# Patient Record
Sex: Male | Born: 1965 | Race: Black or African American | Hispanic: No | Marital: Single | Smoking: Never smoker
Health system: Southern US, Community
[De-identification: ages and names within clinical notes are randomized; demographics above are authoritative.]

## PROBLEM LIST (undated history)

## (undated) DIAGNOSIS — M5430 Sciatica, unspecified side: Secondary | ICD-10-CM

## (undated) HISTORY — DX: Sciatica, unspecified side: M54.30

---

## 2000-11-26 ENCOUNTER — Inpatient Hospital Stay (HOSPITAL_COMMUNITY): Admission: EM | Admit: 2000-11-26 | Discharge: 2000-11-27 | Payer: Self-pay | Admitting: *Deleted

## 2001-03-21 ENCOUNTER — Encounter: Payer: Self-pay | Admitting: Emergency Medicine

## 2001-03-21 ENCOUNTER — Emergency Department (HOSPITAL_COMMUNITY): Admission: EM | Admit: 2001-03-21 | Discharge: 2001-03-21 | Payer: Self-pay | Admitting: Emergency Medicine

## 2003-04-17 ENCOUNTER — Emergency Department (HOSPITAL_COMMUNITY): Admission: EM | Admit: 2003-04-17 | Discharge: 2003-04-17 | Payer: Self-pay | Admitting: *Deleted

## 2003-12-04 ENCOUNTER — Emergency Department (HOSPITAL_COMMUNITY): Admission: EM | Admit: 2003-12-04 | Discharge: 2003-12-04 | Payer: Self-pay | Admitting: Emergency Medicine
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
## 2009-12-17 ENCOUNTER — Emergency Department (HOSPITAL_COMMUNITY): Admission: EM | Admit: 2009-12-17 | Discharge: 2009-12-17 | Payer: Self-pay | Admitting: Emergency Medicine

## 2011-01-05 NOTE — H&P (Signed)
[facility]  Patient:    Todd Flynn, Todd Flynn                      MRN: [id number] Adm. Date:  [id number] Disc. Date: [id number] Attending:  Milford Cage H                   Psychiatric Admission Assessment  IDENTIFYING INFORMATION:  This 45 year old male was admitted after an overdose attempt on eight Tylox.  HISTORY OF PRESENT ILLNESS:  The patient states he has become increasingly depressed and has been ruminating about the death of his mother in 2000-03-27. In addition to his depression and sadness, he has had multiple neurovegetative symptoms, including anhedonia, anergia, difficulty concentrating, insomnia (difficulty falling asleep, middle of the night awakening, early morning awakening), and feelings of hopelessness/worthlessness.  He has had some suicidal ideation, but states that he did not intend to commit suicide.  He was taking the Tylox to relieve some back pain.  He admits that he used very poor judgment in taking eight of these pills, but states his mother used to take that many at a time when she was having pain.  PAST PSYCHIATRIC HISTORY:  The patient has had no previous psychiatric treatment.  PAST MEDICAL HISTORY:  Back pain, otherwise negative.  MEDICATIONS:  None.  DRUG ALLERGIES:  No known drug allergies.  FAMILY HISTORY:  His sister was in [facility] for psychiatric problems.  His sister also had a history of drug abuse.  SUBSTANCE ABUSE HISTORY:  Patient denies.  SOCIAL HISTORY:  The patient lives alone.  He is divorced and has one daughter.  He has a fiancee and states she is very supportive.  He also states he cares a lot for her children.  He denies any physical or sexual abuse.  There are no legal problems.  ADMISSION MENTAL STATUS EXAMINATION:  The patient presented as a friendly, cooperative African-American male with good eye contact.  He was dressed casually with mild psychomotor retardation.  Speech  normal rate and flow. Mood depressed, anxious, but affect wide range.  He denied suicidal or homicidal ideation.  He states he took the Tylox to relieve his back pain, but realizes this was using poor judgment.  There is no evidence of psychosis or perceptual disturbance.  Thought processes logical and goal directed.  Thought content:  No predominant theme.  On cognitive exam, the patient was alert and oriented x 4.  Short term and long term memory were adequate.  General fund of knowledge age and education level appropriate.  Attention and concentration somewhat diminished.  Judgment good, insight good.  ADMISSION DIAGNOSES: Axis I:    Depressive disorder, not otherwise specified. Axis II:   Deferred. Axis III:  Back pain, otherwise healthy. Axis IV:   Severe. Axis V:    Global assessment of functioning, current is 30, highest past            year 70.  STRENGTHS AND ASSETS:  The patient is warm and engaging.  He is able to work to support himself and his family.  He has a help seeking attitude.  He has a supportive fiancee.  PROBLEMS:  Mood instability with vague suicidal ideation.  SHORT TERM TREATMENT GOAL:  Resolution of any suicidal ideation and poor judgment regarding medication use.  LONG TERM TREATMENT GOAL:  Resolution of mood instability.  HOSPITAL COURSE:  The patient stayed for a brief crisis stabilization.  He was able to  talk about the death of his mother.  He states they had been extremely close and it has been difficult for him to deal with her death.  He states, however, that he has a supportive fiancee and his own child as well as her children, whom he loves, and feels that he is reestablishing a family.  He had been working extra hours and we discussed his decreasing these work hours (to the point that he is not working daily).  He has a number of debts, but admits that he is going to have to cut down on his time at work.  His fiancee is very supportive about  this.  He agreed to a trial of an antidepressant, and I am discharging him on Celexa 20 mg q.a.m.  PHYSICAL EXAMINATION:  Was within normal limits except for his being overweight and having back pain.  ADMISSION LABORATORIES:  CBC with differential within normal limits.  Routine chemistry profile within normal limits.  Urine drug screen negative.  Other labs were done in the emergency room prior to his admission.  Thyroid panel still pending.  DISCHARGE MENTAL STATUS EXAMINATION:  The patient continued to be friendly with good eye contact.  Psychomotor retardation had resolved.  Speech normal rate and slow.  Mood less depressed and anxious, but still somewhat affect wide range.  No suicidal or homicidal ideation.  No psychosis or perceptual disturbance.  Thought processes logical and goal directed.  Cognitive back to baseline.  Judgment and insight good.  DISCHARGE DIAGNOSES: Axis I:    Major depressive disorder, single episode, severe, without            psychosis. Axis II:   None. Axis III:  Back pain, otherwise healthy. Axis IV:   Severe. Axis V:    Current global assessment of functioning 50, highest past year            70, admission global assessment of functioning 30.  DISCHARGE MEDICATIONS:  Celexa 20 mg p.o. q.a.m.  ACTIVITY LEVEL:  No restrictions.  DIET:  No restrictions.  POST HOSPITAL CARE PLANS:  The patient will return home.  He has a lot of support from his fiancee.  He is determined to cut down on his work hours in spite of his debt load.  He will follow up at the [facility]. DD:  11/27/00 TD:  11/27/00 Job: [id number]

## 2017-01-05 ENCOUNTER — Encounter (HOSPITAL_COMMUNITY): Payer: Self-pay | Admitting: *Deleted

## 2017-01-05 ENCOUNTER — Emergency Department (HOSPITAL_COMMUNITY)
Admission: EM | Admit: 2017-01-05 | Discharge: 2017-01-05 | Disposition: A | Discharge status: Home / Self Care | Payer: Self-pay | Attending: Emergency Medicine | Admitting: Emergency Medicine

## 2017-01-05 DIAGNOSIS — J029 Acute pharyngitis, unspecified: Secondary | ICD-10-CM | POA: Insufficient documentation

## 2017-01-05 DIAGNOSIS — R1310 Dysphagia, unspecified: Secondary | ICD-10-CM

## 2017-01-05 MED ORDER — SUCRALFATE 1 GM/10ML PO SUSP: 1.0000 g | Freq: Three times a day (TID) | ORAL | 0 refills | Status: AC

## 2017-01-05 MED ORDER — GI COCKTAIL ~~LOC~~
30.0000 mL | Freq: Once | ORAL | Status: AC
  Administered 2017-01-05: 30 mL via ORAL
  Filled 2017-01-05: qty 30

## 2017-01-05 MED ORDER — OMEPRAZOLE 20 MG PO CPDR: 20.0000 mg | DELAYED_RELEASE_CAPSULE | Freq: Every day | ORAL | 1 refills | Status: DC

## 2017-01-05 NOTE — Discharge Instructions (Signed)
We believe your symptoms are a result of GERD (acid reflux).  Please read through the included information and follow up with your regular doctor.  In the meantime, we encourage you to try an over-the-counter medication such as Prilosec OTC.  Give it at least a week at see if your symptoms improve. ° °Return to the Emergency Department with new or worsening symptoms that concern you. ° ° °Heartburn °Heartburn is a type of pain or discomfort that can happen in the throat or chest. It is often described as a burning pain. It may also cause a bad taste in the mouth. Heartburn may feel worse when you lie down or bend over, and it is often worse at night. Heartburn may be caused by stomach contents that move back up into the esophagus (reflux). °HOME CARE INSTRUCTIONS °Take these actions to decrease your discomfort and to help avoid complications. °Diet °Follow a diet as recommended by your health care provider. This may involve avoiding foods and drinks such as: °Coffee and tea (with or without caffeine). °Drinks that contain alcohol. °Energy drinks and sports drinks. °Carbonated drinks or sodas. °Chocolate and cocoa. °Peppermint and mint flavorings. °Garlic and onions. °Horseradish. °Spicy and acidic foods, including peppers, chili powder, curry powder, vinegar, hot sauces, and barbecue sauce. °Citrus fruit juices and citrus fruits, such as oranges, lemons, and limes. °Tomato-based foods, such as red sauce, chili, salsa, and pizza with red sauce. °Fried and fatty foods, such as donuts, french fries, potato chips, and high-fat dressings. °High-fat meats, such as hot dogs and fatty cuts of red and white meats, such as rib eye steak, sausage, ham, and bacon. °High-fat dairy items, such as whole milk, butter, and cream cheese. °Eat small, frequent meals instead of large meals. °Avoid drinking large amounts of liquid with your meals. °Avoid eating meals during the 2-3 hours before bedtime. °Avoid lying down right after you  eat. °Do not exercise right after you eat. ° General Instructions  °Pay attention to any changes in your symptoms. °Take over-the-counter and prescription medicines only as told by your health care provider. Do not take aspirin, ibuprofen, or other NSAIDs unless your health care provider told you to do so. °Do not use any tobacco products, including cigarettes, chewing tobacco, and e-cigarettes. If you need help quitting, ask your health care provider. °Wear loose-fitting clothing. Do not wear anything tight around your waist that causes pressure on your abdomen. °Raise (elevate) the head of your bed about 6 inches (15 cm). °Try to reduce your stress, such as with yoga or meditation. If you need help reducing stress, ask your health care provider. °If you are overweight, reduce your weight to an amount that is healthy for you. Ask your health care provider for guidance about a safe weight loss goal. °Keep all follow-up visits as told by your health care provider. This is important. °SEEK MEDICAL CARE IF: °You have new symptoms. °You have unexplained weight loss. °You have difficulty swallowing, or it hurts to swallow. °You have wheezing or a persistent cough. °Your symptoms do not improve with treatment. °You have frequent heartburn for more than two weeks. °SEEK IMMEDIATE MEDICAL CARE IF: °You have pain in your arms, neck, jaw, teeth, or back. °You feel sweaty, dizzy, or light-headed. °You have chest pain or shortness of breath. °You vomit and your vomit looks like blood or coffee grounds. °Your stool is bloody or black. °  °This information is not intended to replace advice given to you by your health   care provider. Make sure you discuss any questions you have with your health care provider. °  °Document Released: 12/23/2008 Document Revised: 04/27/2015 Document Reviewed: 12/01/2014 °Elsevier Interactive Patient Education ©2016 [employer] ° °Food Choices for Gastroesophageal Reflux Disease, Adult °When you have  gastroesophageal reflux disease (GERD), the foods you eat and your eating habits are very important. Choosing the right foods can help ease the discomfort of GERD. °WHAT GENERAL GUIDELINES DO I NEED TO FOLLOW? °Choose fruits, vegetables, whole grains, low-fat dairy products, and low-fat meat, fish, and poultry. °Limit fats such as oils, salad dressings, butter, nuts, and avocado. °Keep a food diary to identify foods that cause symptoms. °Avoid foods that cause reflux. These may be different for different people. °Eat frequent small meals instead of three large meals each day. °Eat your meals slowly, in a relaxed setting. °Limit fried foods. °Cook foods using methods other than frying. °Avoid drinking alcohol. °Avoid drinking large amounts of liquids with your meals. °Avoid bending over or lying down until 2-3 hours after eating. °WHAT FOODS ARE NOT RECOMMENDED? °The following are some foods and drinks that may worsen your symptoms: °Vegetables °Tomatoes. Tomato juice. Tomato and spaghetti sauce. Chili peppers. Onion and garlic. Horseradish. °Fruits °Oranges, grapefruit, and lemon (fruit and juice). °Meats °High-fat meats, fish, and poultry. This includes hot dogs, ribs, ham, sausage, salami, and bacon. °Dairy °Whole milk and chocolate milk. Sour cream. Cream. Butter. Ice cream. Cream cheese.  °Beverages °Coffee and tea, with or without caffeine. Carbonated beverages or energy drinks. °Condiments °Hot sauce. Barbecue sauce.  °Sweets/Desserts °Chocolate and cocoa. Donuts. Peppermint and spearmint. °Fats and Oils °High-fat foods, including French fries and potato chips. °Other °Vinegar. Strong spices, such as black pepper, white pepper, red pepper, cayenne, curry powder, cloves, ginger, and chili powder. °The items listed above may not be a complete list of foods and beverages to avoid. Contact your dietitian for more information. °  °This information is not intended to replace advice given to you by your health care  provider. Make sure you discuss any questions you have with your health care provider. °  °Document Released: 08/06/2005 Document Revised: 08/27/2014 Document Reviewed: 06/10/2013 °Elsevier Interactive Patient Education ©2016 [employer]vier Inc. ° ° °

## 2017-01-05 NOTE — ED Notes (Signed)
Pt discharged from ED.  AVS and Prescriptions reviewed with verbalized understandings.

## 2017-01-05 NOTE — ED Provider Notes (Signed)
Emergency Department Provider Note By signing my name below, I, Levon HedgerElizabeth Hall, attest that this documentation has been prepared under the direction and in the presence of Aliceson Dolbow, Arlyss RepressJoshua G, MD . Electronically Signed: Levon HedgerElizabeth Hall, Scribe. 01/05/2017. 9:51 PM.   I have reviewed the triage vital signs and the nursing notes.   HISTORY  Chief Complaint Sore Throat  HPI Orlando PennerOtis Garciagarcia is a 51 y.o. male who presents to the Emergency Department complaining of intermittent, moderate sore throat onset four days ago. Pt states he was eating pizza four days ago when he felt as if the food became stuck, causing him to vomit 1x. Since this episode, pt reports experienceing throat discomfort with associated chest congestion.  Per pt, he feels a "large, painful lump" when he swallows.Pain is exacerbated by eating and alleviated by drinking water. No OTC treatments tried for these symptoms PTA. Symptoms are unchanged by walking or exertion. Pt is not currently followed by a GI or a PCP. Pt state he lost his health insurance last month. He denies any changes to his voice or trismus. Pt has no other acute complaints or associated symptoms at this time.    History reviewed. No pertinent past medical history.  There are no active problems to display for this patient.   History reviewed. No pertinent surgical history.   Allergies Patient has no known allergies.  History reviewed. No pertinent family history.  Social History Social History  Substance Use Topics  . Smoking status: Never Smoker  . Smokeless tobacco: Never Used  . Alcohol use No    Review of Systems  Constitutional: No fever/chills Eyes: No visual changes. ENT: Positive sore throat. No voice changes. Cardiovascular: Denies chest pain. Respiratory: Denies shortness of breath. Gastrointestinal: No abdominal pain.  No nausea, 1 episode of vomiting.  No diarrhea.  No constipation. Genitourinary: Negative for  dysuria. Musculoskeletal: Negative for back pain. Skin: Negative for rash. Neurological: Negative for headaches, focal weakness or numbness.  10-point ROS otherwise negative.  ____________________________________________   PHYSICAL EXAM:  VITAL SIGNS: ED Triage Vitals  Enc Vitals Group     BP 01/05/17 2134 139/89     Pulse Rate 01/05/17 2134 99     Resp 01/05/17 2134 20     Temp 01/05/17 2134 98.8 F (37.1 C)     Temp src --      SpO2 01/05/17 2134 99 %     Weight 01/05/17 2136 (!) 345 lb (156.5 kg)     Height 01/05/17 2136 6\' 5"  (1.956 m)     Pain Score 01/05/17 2134 5   Constitutional: Alert and oriented. Well appearing and in no acute distress. Eyes: Conjunctivae are normal.  Head: Atraumatic. Nose: No congestion/rhinnorhea. Mouth/Throat: Mucous membranes are moist.  Oropharynx non-erythematous. No trismus. Managing oral secretions. No muffled voice.  Neck: No stridor.   Cardiovascular: Normal rate, regular rhythm. Good peripheral circulation. Grossly normal heart sounds.   Respiratory: Normal respiratory effort.  No retractions. Lungs CTAB. Gastrointestinal: Soft and nontender. No distention.  Musculoskeletal: No lower extremity tenderness nor edema. No gross deformities of extremities. Neurologic:  Normal speech and language. No gross focal neurologic deficits are appreciated.  Skin:  Skin is warm, dry and intact. No rash noted.  ____________________________________________   PROCEDURES  Procedure(s) performed:   Procedures  None ____________________________________________   INITIAL IMPRESSION / ASSESSMENT AND PLAN / ED COURSE  Pertinent labs & imaging results that were available during my care of the patient were reviewed by me  and considered in my medical decision making (see chart for details).  Patient presents to the ED with sore throat with swallowing and occasional sensation of food getting stuck. No PCP. No acute concern for esophageal food  impaction. Patient with occasional epigastric discomfort. Unclear if this represents GERD, esophageal stricture, or hiatal hernia. Plan for conservative mgmt at home and referral to PCP. Gave contact info for GI in the area but patient without insurance. No chest pain or increased concern for atypical ACS presentation.   At this time, I do not feel there is any life-threatening condition present. I have reviewed and discussed all results (EKG, imaging, lab, urine as appropriate), exam findings with patient. I have reviewed nursing notes and appropriate previous records.  I feel the patient is safe to be discharged home without further emergent workup. Discussed usual and customary return precautions. Patient and family (if present) verbalize understanding and are comfortable with this plan.  Patient will follow-up with their primary care provider. If they do not have a primary care provider, information for follow-up has been provided to them. All questions have been answered.   ____________________________________________  FINAL CLINICAL IMPRESSION(S) / ED DIAGNOSES  Final diagnoses:  Pain with swallowing     MEDICATIONS GIVEN DURING THIS VISIT:  Medications  gi cocktail (Maalox,Lidocaine,Donnatal) (30 mLs Oral Given 01/05/17 2150)     NEW OUTPATIENT MEDICATIONS STARTED DURING THIS VISIT:  Discharge Medication List as of 01/05/2017  9:51 PM    START taking these medications   Details  omeprazole (PRILOSEC) 20 MG capsule Take 1 capsule (20 mg total) by mouth daily., Starting Sat 01/05/2017, Until Mon 02/04/2017, Print    sucralfate (CARAFATE) 1 GM/10ML suspension Take 10 mLs (1 g total) by mouth 4 (four) times daily -  with meals and at bedtime., Starting Sat 01/05/2017, Print       I personally performed the services described in this documentation, which was scribed in my presence. The recorded information has been reviewed and is accurate.   Note:  This document was prepared using  Dragon voice recognition software and may include unintentional dictation errors.  Alona Bene, MD Emergency Medicine    Timea Breed, Arlyss Repress, MD 01/06/17 [phone]

## 2017-01-05 NOTE — ED Triage Notes (Signed)
Pt reports a sore throat "like a lump" in his throat and chest congestion. Pt reports emesis as well earlier in the week.

## 2017-01-05 NOTE — ED Triage Notes (Signed)
Sore throat and congestion for several days  No PCP currently

## 2017-02-04 ENCOUNTER — Encounter: Payer: Self-pay | Admitting: Internal Medicine

## 2017-03-27 ENCOUNTER — Ambulatory Visit: Payer: Self-pay | Admitting: Gastroenterology

## 2017-12-19 ENCOUNTER — Encounter (HOSPITAL_COMMUNITY): Payer: Self-pay | Admitting: Emergency Medicine

## 2017-12-19 ENCOUNTER — Other Ambulatory Visit: Payer: Self-pay

## 2017-12-19 ENCOUNTER — Emergency Department (HOSPITAL_COMMUNITY)
Admission: EM | Admit: 2017-12-19 | Discharge: 2017-12-19 | Disposition: A | Discharge status: Home / Self Care | Payer: Self-pay | Attending: Emergency Medicine | Admitting: Emergency Medicine

## 2017-12-19 DIAGNOSIS — E860 Dehydration: Secondary | ICD-10-CM | POA: Insufficient documentation

## 2017-12-19 DIAGNOSIS — R197 Diarrhea, unspecified: Secondary | ICD-10-CM | POA: Insufficient documentation

## 2017-12-19 DIAGNOSIS — R112 Nausea with vomiting, unspecified: Secondary | ICD-10-CM | POA: Insufficient documentation

## 2017-12-19 DIAGNOSIS — Z79899 Other long term (current) drug therapy: Secondary | ICD-10-CM | POA: Insufficient documentation

## 2017-12-19 LAB — URINALYSIS, ROUTINE W REFLEX MICROSCOPIC
BILIRUBIN URINE: NEGATIVE
Bacteria, UA: NONE SEEN
Glucose, UA: NEGATIVE mg/dL
Ketones, ur: NEGATIVE mg/dL
Leukocytes, UA: NEGATIVE
NITRITE: NEGATIVE
PH: 5 (ref 5.0–8.0)
Protein, ur: NEGATIVE mg/dL
SPECIFIC GRAVITY, URINE: 1.012 (ref 1.005–1.030)

## 2017-12-19 LAB — COMPREHENSIVE METABOLIC PANEL
ALK PHOS: 61 U/L (ref 38–126)
ALT: 37 U/L (ref 17–63)
AST: 26 U/L (ref 15–41)
Albumin: 3.3 g/dL — ABNORMAL LOW (ref 3.5–5.0)
Anion gap: 9 (ref 5–15)
BUN: 14 mg/dL (ref 6–20)
CALCIUM: 8.6 mg/dL — AB (ref 8.9–10.3)
CO2: 24 mmol/L (ref 22–32)
CREATININE: 1.37 mg/dL — AB (ref 0.61–1.24)
Chloride: 107 mmol/L (ref 101–111)
GFR calc non Af Amer: 57 mL/min — ABNORMAL LOW (ref >60)
GLUCOSE: 115 mg/dL — AB (ref 65–99)
Potassium: 3.7 mmol/L (ref 3.5–5.1)
SODIUM: 140 mmol/L (ref 135–145)
Total Bilirubin: 0.6 mg/dL (ref 0.3–1.2)
Total Protein: 6.9 g/dL (ref 6.5–8.1)

## 2017-12-19 LAB — CBC WITH DIFFERENTIAL/PLATELET
BASOS PCT: 0 %
Basophils Absolute: 0 10*3/uL (ref 0.0–0.1)
EOS ABS: 0.3 10*3/uL (ref 0.0–0.7)
EOS PCT: 6 %
HCT: 41.8 % (ref 39.0–52.0)
Hemoglobin: 13.9 g/dL (ref 13.0–17.0)
LYMPHS ABS: 1.6 10*3/uL (ref 0.7–4.0)
Lymphocytes Relative: 36 %
MCH: 26.8 pg (ref 26.0–34.0)
MCHC: 33.3 g/dL (ref 30.0–36.0)
MCV: 80.5 fL (ref 78.0–100.0)
MONO ABS: 0.5 10*3/uL (ref 0.1–1.0)
MONOS PCT: 11 %
NEUTROS PCT: 47 %
Neutro Abs: 2.1 10*3/uL (ref 1.7–7.7)
PLATELETS: 121 10*3/uL — AB (ref 150–400)
RBC: 5.19 MIL/uL (ref 4.22–5.81)
RDW: 14.4 % (ref 11.5–15.5)
WBC: 4.5 10*3/uL (ref 4.0–10.5)

## 2017-12-19 MED ORDER — PROMETHAZINE HCL 25 MG PO TABS: 25.0000 mg | ORAL_TABLET | Freq: Four times a day (QID) | ORAL | 0 refills | Status: AC | PRN

## 2017-12-19 MED ORDER — SODIUM CHLORIDE 0.9 % IV BOLUS
1000.0000 mL | Freq: Once | INTRAVENOUS | Status: AC
  Administered 2017-12-19: 1000 mL via INTRAVENOUS

## 2017-12-19 MED ORDER — ONDANSETRON HCL 4 MG/2ML IJ SOLN
4.0000 mg | Freq: Once | INTRAMUSCULAR | Status: AC
  Administered 2017-12-19: 4 mg via INTRAVENOUS
  Filled 2017-12-19: qty 2

## 2017-12-19 NOTE — ED Triage Notes (Signed)
PT c/o dizziness/lightheadness  developing last night. PT states he had a recent stomach virus with n/v/d x3 days ago.

## 2017-12-19 NOTE — ED Provider Notes (Signed)
[facility] EMERGENCY DEPARTMENT Provider Note   CSN: [id number] Arrival date & time: 12/19/17  8657     History   Chief Complaint Chief Complaint  Patient presents with  . Dizziness    HPI Britney Captain is a 52 y.o. male.  Pt presents to the ED today with dizziness.  The pt said he's had n/v/d for the past 3 days.  This morning, he got up and felt dizzy.  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  He denies any pain.  No f/c.     History reviewed. No pertinent past medical history.  There are no active problems to display for this patient.   History reviewed. No pertinent surgical history.      Home Medications    Prior to Admission medications   Medication Sig Start Date End Date Taking? Authorizing Provider  omeprazole (PRILOSEC) 20 MG capsule Take 1 capsule (20 mg total) by mouth daily. 01/05/17 02/04/17  Long, Arlyss Repress, MD  promethazine (PHENERGAN) 25 MG tablet Take 1 tablet (25 mg total) by mouth every 6 (six) hours as needed for nausea or vomiting. 12/19/17   Jacalyn Lefevre, MD  sucralfate (CARAFATE) 1 GM/10ML suspension Take 10 mLs (1 g total) by mouth 4 (four) times daily -  with meals and at bedtime. 01/05/17   Long, Arlyss Repress, MD    Family History History reviewed. No pertinent family history.  Social History Social History   Tobacco Use  . Smoking status: Never Smoker  . Smokeless tobacco: Never Used  Substance Use Topics  . Alcohol use: No  . Drug use: No     Allergies   Patient has no known allergies.   Review of Systems Review of Systems  Gastrointestinal: Positive for diarrhea, nausea and vomiting.  Neurological: Positive for weakness.  All other systems reviewed and are negative.    Physical Exam Updated Vital Signs BP 108/80 (BP Location: Left Arm)   Pulse 75   Temp 98.6 F (37 C) (Oral)   Resp (!) 22   Ht  (1.956 m)   Wt (!) 148.8 kg (328 lb)   SpO2 93%   BMI 38.90 kg/m   Physical  Exam  Constitutional: He is oriented to person, place, and time. He appears well-developed and well-nourished.  HENT:  Head: Normocephalic and atraumatic.  Right Ear: External ear normal.  Left Ear: External ear normal.  Nose: Nose normal.  Mouth/Throat: Mucous membranes are dry.  Eyes: Pupils are equal, round, and reactive to light. Conjunctivae and EOM are normal.  Neck: Normal range of motion. Neck supple.  Cardiovascular: Normal rate, regular rhythm, normal heart sounds and intact distal pulses.  Pulmonary/Chest: Effort normal and breath sounds normal.  Abdominal: Soft. Bowel sounds are normal.  Musculoskeletal: Normal range of motion.  Neurological: He is alert and oriented to person, place, and time.  Skin: Skin is warm. Capillary refill takes less than 2 seconds.  Psychiatric: He has a normal mood and affect. His behavior is normal. Judgment and thought content normal.  Nursing note and vitals reviewed.    ED Treatments / Results  Labs (all labs ordered are listed, but only abnormal results are displayed) Labs Reviewed  COMPREHENSIVE METABOLIC PANEL - Abnormal; Notable for the following components:      Result Value   Glucose, Bld 115 (*)    Creatinine, Ser 1.37 (*)    Calcium 8.6 (*)    Albumin 3.3 (*)  GFR calc non Af Amer 57 (*)    All other components within normal limits  CBC WITH DIFFERENTIAL/PLATELET - Abnormal; Notable for the following components:   Platelets 121 (*)    All other components within normal limits  URINALYSIS, ROUTINE W REFLEX MICROSCOPIC - Abnormal; Notable for the following components:   Hgb urine dipstick SMALL (*)    All other components within normal limits    EKG None  Radiology No results found.  Procedures Procedures (including critical care time)  Medications Ordered in ED Medications  sodium chloride 0.9 % bolus 1,000 mL (0 mLs Intravenous Stopped 12/19/17 1151)  ondansetron (ZOFRAN) injection 4 mg (4 mg Intravenous Given  12/19/17 1032)     Initial Impression / Assessment and Plan / ED Course  I have reviewed the triage vital signs and the nursing notes.  Pertinent labs & imaging results that were available during my care of the patient were reviewed by me and considered in my medical decision making (see chart for details).    After 1L NS, pt is feeling much better.  He is stable for d/c and instructed to return if worse.   Final Clinical Impressions(s) / ED Diagnoses   Final diagnoses:  Dehydration  Nausea vomiting and diarrhea    ED Discharge Orders        Ordered    promethazine (PHENERGAN) 25 MG tablet  Every 6 hours PRN     12/19/17 1209       Jacalyn Lefevre, MD 12/19/17 1210

## 2017-12-19 NOTE — ED Notes (Signed)
Pt c/o dizziness this morning.  States he has had vomiting and diarrhea the last 3 days.  Denies any pain

## 2019-10-13 ENCOUNTER — Ambulatory Visit: Payer: Self-pay | Admitting: *Deleted

## 2019-10-13 NOTE — Telephone Encounter (Signed)
Pt reports he was cleaning out his 2 dogs "Units" Sunday and "The urine and feces was so bad I now have a headache."States 4/10, frontal, tender at both eye areas. No visual changes, BP WNL, checks at home. Home care advise given, pt verbalizes understanding. Advised ED/UC if symptoms worsens.   Reason for Disposition . Headache  Answer Assessment - Initial Assessment Questions 1. LOCATION: "Where does it hurt?"     Frontal headache 2. ONSET: "When did the headache start?" (Minutes, hours or days)      Sunday night 3. PATTERN: "Does the pain come and go, or has it been constant since it started?"     Comes and goes 4. SEVERITY: "How bad is the pain?" and "What does it keep you from doing?"  (e.g., Scale 1-10; mild, moderate, or severe)   - MILD (1-3): doesn't interfere with normal activities    - MODERATE (4-7): interferes with normal activities or awakens from sleep    - SEVERE (8-10): excruciating pain, unable to do any normal activities        5/10 5. RECURRENT SYMPTOM: "Have you ever had headaches before?" If so, ask: "When was the last time?" and "What happened that time?"      no 6. CAUSE: "What do you think is causing the headache?"     Cleaning out dog cages 7. MIGRAINE: "Have you been diagnosed with migraine headaches?" If so, ask: "Is this headache similar?"      NO 8. HEAD INJURY: "Has there been any recent injury to the head?"      no 9. OTHER SYMPTOMS: "Do you have any other symptoms?" (fever, stiff neck, eye pain, sore throat, cold symptoms)     Eye pain  Protocols used: HEADACHE-A-AH

## 2022-10-21 ENCOUNTER — Other Ambulatory Visit: Payer: Self-pay

## 2022-10-21 ENCOUNTER — Encounter (HOSPITAL_COMMUNITY): Payer: Self-pay

## 2022-10-21 ENCOUNTER — Emergency Department (HOSPITAL_COMMUNITY)
Admission: EM | Admit: 2022-10-21 | Discharge: 2022-10-21 | Disposition: A | Discharge status: Home / Self Care | Payer: Self-pay | Attending: Student | Admitting: Student

## 2022-10-21 DIAGNOSIS — D649 Anemia, unspecified: Secondary | ICD-10-CM | POA: Insufficient documentation

## 2022-10-21 DIAGNOSIS — K625 Hemorrhage of anus and rectum: Secondary | ICD-10-CM | POA: Insufficient documentation

## 2022-10-21 DIAGNOSIS — R944 Abnormal results of kidney function studies: Secondary | ICD-10-CM | POA: Insufficient documentation

## 2022-10-21 LAB — CBC WITH DIFFERENTIAL/PLATELET
Abs Immature Granulocytes: 0 10*3/uL (ref 0.00–0.07)
Basophils Absolute: 0 10*3/uL (ref 0.0–0.1)
Basophils Relative: 1 %
Eosinophils Absolute: 0.3 10*3/uL (ref 0.0–0.5)
Eosinophils Relative: 5 %
HCT: 39.8 % (ref 39.0–52.0)
Hemoglobin: 12.9 g/dL — ABNORMAL LOW (ref 13.0–17.0)
Immature Granulocytes: 0 %
Lymphocytes Relative: 31 %
Lymphs Abs: 1.7 10*3/uL (ref 0.7–4.0)
MCH: 26.9 pg (ref 26.0–34.0)
MCHC: 32.4 g/dL (ref 30.0–36.0)
MCV: 82.9 fL (ref 80.0–100.0)
Monocytes Absolute: 0.6 10*3/uL (ref 0.1–1.0)
Monocytes Relative: 11 %
Neutro Abs: 2.9 10*3/uL (ref 1.7–7.7)
Neutrophils Relative %: 52 %
Platelets: 140 10*3/uL — ABNORMAL LOW (ref 150–400)
RBC: 4.8 MIL/uL (ref 4.22–5.81)
RDW: 14.9 % (ref 11.5–15.5)
WBC: 5.5 10*3/uL (ref 4.0–10.5)
nRBC: 0 % (ref 0.0–0.2)

## 2022-10-21 LAB — PROTIME-INR
INR: 1.1 (ref 0.8–1.2)
Prothrombin Time: 13.9 seconds (ref 11.4–15.2)

## 2022-10-21 LAB — COMPREHENSIVE METABOLIC PANEL
ALT: 18 U/L (ref 0–44)
AST: 16 U/L (ref 15–41)
Albumin: 3.5 g/dL (ref 3.5–5.0)
Alkaline Phosphatase: 55 U/L (ref 38–126)
Anion gap: 4 — ABNORMAL LOW (ref 5–15)
BUN: 20 mg/dL (ref 6–20)
CO2: 27 mmol/L (ref 22–32)
Calcium: 8.7 mg/dL — ABNORMAL LOW (ref 8.9–10.3)
Chloride: 109 mmol/L (ref 98–111)
Creatinine, Ser: 1.43 mg/dL — ABNORMAL HIGH (ref 0.61–1.24)
GFR, Estimated: 57 mL/min — ABNORMAL LOW (ref >60)
Glucose, Bld: 90 mg/dL (ref 70–99)
Potassium: 4.3 mmol/L (ref 3.5–5.1)
Sodium: 140 mmol/L (ref 135–145)
Total Bilirubin: 0.6 mg/dL (ref 0.3–1.2)
Total Protein: 6.9 g/dL (ref 6.5–8.1)

## 2022-10-21 MED ORDER — OMEPRAZOLE 20 MG PO CPDR: 20.0000 mg | DELAYED_RELEASE_CAPSULE | Freq: Two times a day (BID) | ORAL | 0 refills | Status: AC

## 2022-10-21 NOTE — ED Triage Notes (Signed)
Pt reports maroon colored blood in stool and has recently been taking advil, ibuprofen, diclofenac and meloxicam.

## 2022-10-22 ENCOUNTER — Telehealth: Payer: Self-pay

## 2022-10-22 NOTE — ED Provider Notes (Signed)
[facility] Provider Note  CSN: [id number] Arrival date & time: 10/21/22 1818  Chief Complaint(s) Blood In Stools  HPI Todd Flynn is a 57 y.o. male who presents emergency department for evaluation of bleeding.  Patient states that over the last 24 hours he has noticed multiple episodes of bright red blood per rectum and states that he did start a new medication for his sciatica.  It appears patient has been taking a combination of Advil, oral diclofenac and meloxicam.  However, patient does not endorse any abdominal pain or rectal pain.  Denies chest pain, shortness of breath, fatigue, nausea, vomiting or other systemic symptoms.   Past Medical History History reviewed. No pertinent past medical history. There are no problems to display for this patient.  Home Medication(s) Prior to Admission medications   Medication Sig Start Date End Date Taking? Authorizing Provider  omeprazole (PRILOSEC) 20 MG capsule Take 1 capsule (20 mg total) by mouth 2 (two) times daily before a meal. 10/21/22 11/20/22  Cumi Sanagustin, MD  promethazine (PHENERGAN) 25 MG tablet Take 1 tablet (25 mg total) by mouth every 6 (six) hours as needed for nausea or vomiting. 12/19/17   Isla Pence, MD  sucralfate (CARAFATE) 1 GM/10ML suspension Take 10 mLs (1 g total) by mouth 4 (four) times daily -  with meals and at bedtime. 01/05/17   Long, Wonda Olds, MD                                                                                                                                    Past Surgical History History reviewed. No pertinent surgical history. Family History History reviewed. No pertinent family history.  Social History Social History   Tobacco Use   Smoking status: Never   Smokeless tobacco: Never  Vaping Use   Vaping Use: Never used  Substance Use Topics   Alcohol use: No   Drug use: No   Allergies Shrimp (diagnostic)  Review of Systems Review of  Systems  Gastrointestinal:  Positive for blood in stool.    Physical Exam Vital Signs  I have reviewed the triage vital signs BP (!) 151/93   Pulse (!) 57   Temp 98 F (36.7 C)   Resp 18   Ht 6' 4.25" (1.937 m)   Wt (!) 169.6 kg   SpO2 93%   BMI 45.23 kg/m   Physical Exam Constitutional:      General: He is not in acute distress.    Appearance: Normal appearance.  HENT:     Head: Normocephalic and atraumatic.     Nose: No congestion or rhinorrhea.  Eyes:     General:        Right eye: No discharge.        Left eye: No discharge.     Extraocular Movements: Extraocular movements intact.     Pupils: Pupils are equal, round, and  reactive to light.  Cardiovascular:     Rate and Rhythm: Normal rate and regular rhythm.     Heart sounds: No murmur heard. Pulmonary:     Effort: No respiratory distress.     Breath sounds: No wheezing or rales.  Abdominal:     General: There is no distension.     Tenderness: There is no abdominal tenderness.  Genitourinary:    Comments: Scant amount of blood at the rectal vault Musculoskeletal:        General: Normal range of motion.     Cervical back: Normal range of motion.  Skin:    General: Skin is warm and dry.  Neurological:     General: No focal deficit present.     Mental Status: He is alert.     ED Results and Treatments Labs (all labs ordered are listed, but only abnormal results are displayed) Labs Reviewed  COMPREHENSIVE METABOLIC PANEL - Abnormal; Notable for the following components:      Result Value   Creatinine, Ser 1.43 (*)    Calcium 8.7 (*)    GFR, Estimated 57 (*)    Anion gap 4 (*)    All other components within normal limits  CBC WITH DIFFERENTIAL/PLATELET - Abnormal; Notable for the following components:   Hemoglobin 12.9 (*)    Platelets 140 (*)    All other components within normal limits  GASTROINTESTINAL PANEL BY PCR, STOOL (REPLACES STOOL CULTURE)  PROTIME-INR                                                                                                                           Radiology No results found.  Pertinent labs & imaging results that were available during my care of the patient were reviewed by me and considered in my medical decision making (see MDM for details).  Medications Ordered in ED Medications - No data to display                                                                                                                                   Procedures Procedures  (including critical care time)  Medical Decision Making / ED Course   This patient presents to the ED for concern of rectal bleeding, this involves an extensive number of treatment options, and is a complaint that carries with it a high risk of complications and morbidity.  The  differential diagnosis includes IBD flare, enteritis, infectious diarrhea, abdominal malignancy, anal fissure, internal hemorrhoid, external hemorrhoid, diverticular bleed, hemangiodysplasia, retained foreign body  MDM: Patient seen emergency room for evaluation of rectal bleeding.  Physical exam with scant amount of gross blood seen at the rectum with no external evidence of hemorrhoids.  Laboratory evaluation with a creatinine of 1.43, hemoglobin 12.9 which is only slightly down from hemoglobin obtained last year at an outside emergency department at 13.5.  I spoke with the gastroenterologist on-call Dr. Jenetta Downer who is recommending PPI twice daily and outpatient GI follow-up.  I performed a med rec with the patient and we discontinued all NSAID therapy at the patient is currently taking.  He is not on any blood thinning medications at this time.  PPI sent to patient's pharmacy and patient discharged with strict return precautions of which she voiced understanding.  He has remained hemodynamically stable here and voices understanding of plan.  Patient then discharged   Additional history obtained: -Additional history obtained  from partner -External records from outside source obtained and reviewed including: Chart review including previous notes, labs, imaging, consultation notes   Lab Tests: -I ordered, reviewed, and interpreted labs.   The pertinent results include:   Labs Reviewed  COMPREHENSIVE METABOLIC PANEL - Abnormal; Notable for the following components:      Result Value   Creatinine, Ser 1.43 (*)    Calcium 8.7 (*)    GFR, Estimated 57 (*)    Anion gap 4 (*)    All other components within normal limits  CBC WITH DIFFERENTIAL/PLATELET - Abnormal; Notable for the following components:   Hemoglobin 12.9 (*)    Platelets 140 (*)    All other components within normal limits  GASTROINTESTINAL PANEL BY PCR, STOOL (REPLACES STOOL CULTURE)  PROTIME-INR      Medicines ordered and prescription drug management: Meds ordered this encounter  Medications   omeprazole (PRILOSEC) 20 MG capsule    Sig: Take 1 capsule (20 mg total) by mouth 2 (two) times daily before a meal.    Dispense:  60 capsule    Refill:  0    -I have reviewed the patients home medicines and have made adjustments as needed  Critical interventions none  Consultations Obtained: I requested consultation with the gastroenterologist on-call Dr. Jenetta Downer,  and discussed lab and imaging findings as well as pertinent plan - they recommend: PPI twice daily and outpatient follow-up   Cardiac Monitoring: The patient was maintained on a cardiac monitor.  I personally viewed and interpreted the cardiac monitored which showed an underlying rhythm of: NSR  Social Determinants of Health:  Factors impacting patients care include: none   Reevaluation: After the interventions noted above, I reevaluated the patient and found that they have :improved  Co morbidities that complicate the patient evaluation History reviewed. No pertinent past medical history.    Dispostion: I considered admission for this patient, but he currently does not  meet inpatient criteria for admission and is safe for discharge with outpatient GI follow-up with return precautions of which she and his partner voiced understanding     Final Clinical Impression(s) / ED Diagnoses Final diagnoses:  Rectal bleeding     '@PCDICTATION'$ @    Teressa Lower, MD 10/22/22 [id number]

## 2022-10-22 NOTE — Telephone Encounter (Signed)
Attempted 1st attempt call for follow up of [facility] client after recent ER visit to [facility] on 10/21/22 for rectal bleeding. Primary care provider [facility] last appointment was 10/04/22 and his next appointment is scheduled for 11/29/22 at 5 PM.  No answer today, unable to leave a voicemail due to mailbox full.  Also upcoming appointment with Marylee Floras on 10/31/22 at 0830.  Plan: will continue to attempt to reach and check for resources, medication compliance and symptoms as well as recommendation to call [facility] for a ER follow up visit.  [employer]

## 2022-10-23 ENCOUNTER — Telehealth: Payer: Self-pay

## 2022-10-23 NOTE — Telephone Encounter (Signed)
2nd attempt to follow up with [facility] client after recent ER visit. No answer, left message.    [place] [employer]

## 2022-10-25 ENCOUNTER — Telehealth: Payer: Self-pay

## 2022-10-25 NOTE — Telephone Encounter (Signed)
Attempted call to [facility] client for follow up after recent visit to [facility] ER on 10/21/22 for rectal bleeding.  He reports things are doing better, confirmed he is not taking NSAIDS as instructed. He is taking the omeprazole as prescribed.  Reviewed that he has an upcoming appointment with Marylee Floras on 10/31/22 at 19am. Next [facility] appointment (PCP) 11/29/22 at 5pm.  He states he is looking for insurance but "everything is so high" Inquired if client has tried applying for Medicaid with the new expansion and he states he has not. Discussed ways to apply such as online, at [place] or offered coming to [facility] office. He states he knows someone who works at [employer] and he will contact her today and possibly try to apply on 11/29/22 while in the area for his PCP appointment. Discussed reasons why he would contact his provider or the ER as in instructions of increased bleeding rectally or any new symptoms. He says he understands. Plan: follow up with client by phone in a week after his Gastro appointment. Client agreeable    [place]/[facility]

## 2022-10-30 NOTE — Progress Notes (Signed)
GI Office Note    Referring Provider: Teressa Lower, MD Primary Care Physician:  Patient, No Pcp Per  Primary Gastroenterologist: Cristopher Estimable.Rourk, MD  Chief Complaint   Chief Complaint  Patient presents with   New Patient (Initial Visit)    Ov visit from ER visit, colonoscopy   History of Present Illness   Todd Flynn is a 57 y.o. male presenting today at the request of Kommor, [place], MD for rectal bleeding  ED visit for rectal bleeding 10/21/2022.  For 24 hours of multiple episodes of BRBPR.  Patient reports combination of Advil, oral diclofenac, meloxicam.  Denies any abdominal pain or rectal pain.  Denies any shortness of breath, fatigue, nausea, vomiting.  Patient has scant amount of gross blood at the rectum without evidence of external hemorrhoids.  Hemoglobin stable at 12.9 additionally slightly down from prior hemoglobin of 13.5.  ED consulted with Dr. Jenetta Downer who recommended twice daily PPI and outpatient GI follow-up.  Advised patient to discontinue all NSAIDs.  PPI sent to patient pharmacy.  No prior colonoscopy on file.  Today: Started having some rectal bleeding on and off for a couple months. Was taking Advil and diclofenac with meloxicam for pain but since then he had started having some intermittent bleeding. Reports he had one dark/black stool and then he was having lighter bright red stools. Reports he had a rectal exam at his PCP office. Last had some bleeding this past Sunday. PCP put him on omeprazole. Repots he tried tumerol for his joint pain.   No overt melena. No abdominal pain. Has a good appetite but no overarching appetite. No typical reflux symptoms. No unintentional. No constipation or diarrhea. No chest pain , shortness of breath, dizziness.  Denies any epigastric pain or any upper GI symptoms at all.  No hemorrhoids noted. Reports he has been walking everyday for about 15 minutes or so a day and has been running a little as well.   He does  report a history of abdominal surgery due to food poisoning and some possible parasites. That was done in 2011.    Current Outpatient Medications  Medication Sig Dispense Refill   omeprazole (PRILOSEC) 20 MG capsule Take 1 capsule (20 mg total) by mouth 2 (two) times daily before a meal. 60 capsule 0   promethazine (PHENERGAN) 25 MG tablet Take 1 tablet (25 mg total) by mouth every 6 (six) hours as needed for nausea or vomiting. 10 tablet 0   sucralfate (CARAFATE) 1 GM/10ML suspension Take 10 mLs (1 g total) by mouth 4 (four) times daily -  with meals and at bedtime. 420 mL 0   traMADol (ULTRAM) 50 MG tablet Take 50 mg by mouth every 6 (six) hours as needed.     Vitamin D, Ergocalciferol, (DRISDOL) 1.25 MG (50000 UNIT) CAPS capsule Take 50,000 Units by mouth once a week.     polyethylene glycol-electrolytes (NULYTELY) 420 g solution Take 4,000 mLs by mouth once for 1 dose. 4000 mL 0   No current facility-administered medications for this visit.    Past Medical History:  Diagnosis Date   Sciatica     History reviewed. No pertinent surgical history.  History reviewed. No pertinent family history.  Allergies as of 10/31/2022 - Review Complete 10/31/2022  Allergen Reaction Noted   Shrimp (diagnostic) Swelling 10/21/2022    Social History   Socioeconomic History   Marital status: Single    Spouse name: Not on file   Number of children: Not on  file   Years of education: Not on file   Highest education level: Not on file  Occupational History   Not on file  Tobacco Use   Smoking status: Never   Smokeless tobacco: Never  Vaping Use   Vaping Use: Never used  Substance and Sexual Activity   Alcohol use: No   Drug use: No   Sexual activity: Not on file  Other Topics Concern   Not on file  Social History Narrative   Not on file   Social Determinants of Health   Financial Resource Strain: Not on file  Food Insecurity: Not on file  Transportation Needs: Not on file   Physical Activity: Not on file  Stress: Not on file  Social Connections: Not on file  Intimate Partner Violence: Not on file     Review of Systems   Gen: Denies any fever, chills, fatigue, weight loss, lack of appetite.  CV: Denies chest pain, heart palpitations, peripheral edema, syncope.  Resp: Denies shortness of breath at rest or with exertion. Denies wheezing or cough.  GI: see HPI GU : Denies urinary burning, urinary frequency, urinary hesitancy MS: Denies joint pain, muscle weakness, cramps, or limitation of movement.  Derm: Denies rash, itching, dry skin Psych: Denies depression, anxiety, memory loss, and confusion Heme: Denies bruising, bleeding, and enlarged lymph nodes.   Physical Exam   BP 139/84   Pulse 62   Temp 98.2 F (36.8 C)   Ht '6\' 5"'$  (1.956 m)   Wt (!) 394 lb 12.8 oz (179.1 kg)   BMI 46.82 kg/m   General:   Alert and oriented. Pleasant and cooperative. Well-nourished and well-developed.  Head:  Normocephalic and atraumatic. Eyes:  Without icterus, sclera clear and conjunctiva pink.  Ears:  Normal auditory acuity. Mouth:  No deformity or lesions, oral mucosa pink.  Lungs:  Clear to auscultation bilaterally. No wheezes, rales, or rhonchi. No distress.  Heart:  S1, S2 present without murmurs appreciated.  Abdomen:  +BS, soft, non-tender and non-distended, obese. No HSM noted. No guarding or rebound. No masses appreciated.  Rectal:  Deferred  Msk:  Symmetrical without gross deformities. Normal posture. Extremities:  Without edema. Neurologic:  Alert and  oriented x4;  grossly normal neurologically. Skin:  Intact without significant lesions or rashes. Psych:  Alert and cooperative. Normal mood and affect.   Assessment   Todd Flynn is a 57 y.o. male with no documented medical history presenting today for evaluation of rectal bleeding.  Rectal bleeding, screening for colon cancer: Intermittent rectal bleeding for couple months.  Admitted to  frequent NSAID use just prior to this.  Appears he was taking diclofenac, Advil, and meloxicam as well as possibly Robaxin for pain.  Currently not taking any of these.  Appears he was given omeprazole by the ED physician at his visit.  States he had a digital rectal exam and was told he did not have any hemorrhoids.  He denies any constipation or diarrhea.  Reported 1 possible episode of melena but unclear as all of his bowel movements have been bright red.  Hemoglobin at ED visit was 12.9.  He denies any dizziness, lightheadedness, syncope, chest pain, shortness of breath.  His last episode he had was on Saturday and it was just a small amount.  His rectal bleeding has been painless.  Has been working on weight loss.  Does not have any significant lack of appetite and does exercise regularly.  No unintentional weight loss.  Advised him to  continue to avoid NSAIDs and continue his PPI for now.  Advised him to monitor for any ongoing rectal bleeding and if he develops any significant melena or upper GI symptoms that we may need to proceed with EGD.  He has never had a colonoscopy therefore we will proceed with [facility] for further evaluation of his rectal bleeding and to screen for colon cancer.  PLAN   Proceed with colonoscopy with propofol by Dr. Gala Romney in near future: the risks, benefits, and alternatives have been discussed with the patient in detail. The patient states understanding and desires to proceed. ASA 3 (BMI) Continue omeprazole 20 mg twice daily.  Pending results of colonoscopy we may discontinue this. Continue to monitor for any ongoing rectal bleeding or any melena. Avoid NSAIDs Follow 2 months post procedure.     Venetia Night, MSN, FNP-BC, AGACNP-BC [facility]

## 2022-10-31 ENCOUNTER — Encounter: Payer: Self-pay | Admitting: *Deleted

## 2022-10-31 ENCOUNTER — Encounter: Payer: Self-pay | Admitting: Gastroenterology

## 2022-10-31 ENCOUNTER — Other Ambulatory Visit: Payer: Self-pay | Admitting: *Deleted

## 2022-10-31 ENCOUNTER — Ambulatory Visit (INDEPENDENT_AMBULATORY_CARE_PROVIDER_SITE_OTHER): Payer: Self-pay | Admitting: Gastroenterology

## 2022-10-31 ENCOUNTER — Telehealth: Payer: Self-pay | Admitting: *Deleted

## 2022-10-31 VITALS — BP 139/84 | HR 62 | Temp 98.2°F | Ht 77.0 in | Wt 394.8 lb

## 2022-10-31 DIAGNOSIS — K625 Hemorrhage of anus and rectum: Secondary | ICD-10-CM

## 2022-10-31 DIAGNOSIS — Z1211 Encounter for screening for malignant neoplasm of colon: Secondary | ICD-10-CM

## 2022-10-31 MED ORDER — PEG 3350-KCL-NA BICARB-NACL 420 G PO SOLR: 4000.0000 mL | Freq: Once | ORAL | 0 refills | Status: AC

## 2022-10-31 NOTE — Telephone Encounter (Signed)
Rica Mote PA: Ref #: [id number] 10/31/22 940 am ElijahB

## 2022-10-31 NOTE — Patient Instructions (Addendum)
We are scheduling you for a colonoscopy in the near future with Dr. Gala Romney.  You will be provided a separate detailed list of instructions regarding your prep.  For now continue to take omeprazole twice 20 mg daily.  Please contact the office if you have any upper GI symptoms including reflux, upper abdominal pain, or any black/tarry stools.  It was a pleasure to see you today. I want to create trusting relationships with patients. If you receive a survey regarding your visit,  I greatly appreciate you taking time to fill this out on paper or through your MyChart. I value your feedback.  Venetia Night, MSN, FNP-BC, AGACNP-BC [facility]

## 2022-11-22 ENCOUNTER — Encounter: Payer: Self-pay | Admitting: Gastroenterology

## 2022-12-12 ENCOUNTER — Encounter (HOSPITAL_COMMUNITY): Payer: Self-pay

## 2022-12-12 ENCOUNTER — Encounter (HOSPITAL_COMMUNITY)
Admission: RE | Admit: 2022-12-12 | Discharge: 2022-12-12 | Disposition: A | Discharge status: Home / Self Care | Payer: 59 | Source: Ambulatory Visit | Attending: Internal Medicine | Admitting: Internal Medicine

## 2022-12-14 ENCOUNTER — Ambulatory Visit (HOSPITAL_COMMUNITY): Payer: 59 | Admitting: Anesthesiology

## 2022-12-14 ENCOUNTER — Ambulatory Visit (HOSPITAL_COMMUNITY)
Admission: RE | Admit: 2022-12-14 | Discharge: 2022-12-14 | Disposition: A | Discharge status: Home / Self Care | Payer: 59 | Attending: Internal Medicine | Admitting: Internal Medicine

## 2022-12-14 ENCOUNTER — Encounter (HOSPITAL_COMMUNITY): Payer: Self-pay | Admitting: Internal Medicine

## 2022-12-14 ENCOUNTER — Ambulatory Visit (HOSPITAL_BASED_OUTPATIENT_CLINIC_OR_DEPARTMENT_OTHER): Payer: 59 | Admitting: Anesthesiology

## 2022-12-14 ENCOUNTER — Encounter (HOSPITAL_COMMUNITY)
Admission: RE | Disposition: A | Discharge status: Home / Self Care | Payer: Self-pay | Source: Home / Self Care | Attending: Internal Medicine

## 2022-12-14 DIAGNOSIS — D122 Benign neoplasm of ascending colon: Secondary | ICD-10-CM | POA: Insufficient documentation

## 2022-12-14 DIAGNOSIS — K921 Melena: Secondary | ICD-10-CM | POA: Insufficient documentation

## 2022-12-14 DIAGNOSIS — D124 Benign neoplasm of descending colon: Secondary | ICD-10-CM | POA: Diagnosis not present

## 2022-12-14 DIAGNOSIS — Q438 Other specified congenital malformations of intestine: Secondary | ICD-10-CM | POA: Insufficient documentation

## 2022-12-14 DIAGNOSIS — Z6841 Body Mass Index (BMI) 40.0 and over, adult: Secondary | ICD-10-CM

## 2022-12-14 DIAGNOSIS — K625 Hemorrhage of anus and rectum: Secondary | ICD-10-CM

## 2022-12-14 DIAGNOSIS — Z1211 Encounter for screening for malignant neoplasm of colon: Secondary | ICD-10-CM

## 2022-12-14 HISTORY — PX: COLONOSCOPY WITH PROPOFOL: SHX5780

## 2022-12-14 HISTORY — PX: POLYPECTOMY: SHX5525

## 2022-12-14 HISTORY — PX: SCLEROTHERAPY: SHX6841

## 2022-12-14 SURGERY — COLONOSCOPY WITH PROPOFOL
Anesthesia: General

## 2022-12-14 MED ORDER — SPOT INK MARKER SYRINGE KIT
PACK | SUBMUCOSAL | Status: AC
  Filled 2022-12-14: qty 5

## 2022-12-14 MED ORDER — PROPOFOL 500 MG/50ML IV EMUL
INTRAVENOUS | Status: DC | PRN
  Administered 2022-12-14: 100 ug/kg/min via INTRAVENOUS

## 2022-12-14 MED ORDER — SPOT INK MARKER SYRINGE KIT
PACK | SUBMUCOSAL | Status: DC | PRN
  Administered 2022-12-14: .5 mL via SUBMUCOSAL

## 2022-12-14 MED ORDER — PHENYLEPHRINE 80 MCG/ML (10ML) SYRINGE FOR IV PUSH (FOR BLOOD PRESSURE SUPPORT)
PREFILLED_SYRINGE | INTRAVENOUS | Status: DC | PRN
  Administered 2022-12-14 (×6): 160 ug via INTRAVENOUS

## 2022-12-14 MED ORDER — LIDOCAINE HCL (CARDIAC) PF 100 MG/5ML IV SOSY
PREFILLED_SYRINGE | INTRAVENOUS | Status: DC | PRN
  Administered 2022-12-14: 50 mg via INTRAVENOUS

## 2022-12-14 MED ORDER — LACTATED RINGERS IV SOLN: INTRAVENOUS | Status: DC

## 2022-12-14 MED ORDER — PROPOFOL 10 MG/ML IV BOLUS
INTRAVENOUS | Status: DC | PRN
  Administered 2022-12-14: 50 mg via INTRAVENOUS
  Administered 2022-12-14: 100 mg via INTRAVENOUS

## 2022-12-14 MED ORDER — PHENYLEPHRINE 80 MCG/ML (10ML) SYRINGE FOR IV PUSH (FOR BLOOD PRESSURE SUPPORT)
PREFILLED_SYRINGE | INTRAVENOUS | Status: AC
  Filled 2022-12-14: qty 10

## 2022-12-14 NOTE — Transfer of Care (Signed)
Immediate Anesthesia Transfer of Care Note  Patient: Todd Flynn  Procedure(s) Performed: COLONOSCOPY WITH PROPOFOL  Patient Location: Short Stay  Anesthesia Type:General  Level of Consciousness: drowsy  Airway & Oxygen Therapy: Patient Spontanous Breathing and Patient connected to face mask oxygen  Post-op Assessment: Report given to RN and Post -op Vital signs reviewed and stable  Post vital signs: Reviewed and stable  Last Vitals:  Vitals Value Taken Time  BP    Temp    Pulse    Resp    SpO2      Last Pain:  Vitals:   12/14/22 0804  PainSc: 0-No pain      Patients Stated Pain Goal: 7 (12/14/22 0804)  Complications: No notable events documented.

## 2022-12-14 NOTE — Op Note (Signed)
[facility] Patient Name: Todd Flynn Procedure Date: 12/14/2022 7:52 AM MRN: [id number] Date of Birth: Dec 12, 1965 Attending MD: Gennette Pac , MD, [id number] CSN: [id number] Age: 56 Admit Type: Outpatient Procedure:                Colonoscopy Indications:              Hematochezia Providers:                Gennette Pac, MD, Nena Polio, RN, Pandora Leiter, Technician Referring MD:              Medicines:                Propofol per Anesthesia Complications:            No immediate complications. Estimated Blood Loss:     Estimated blood loss was minimal. Procedure:                Pre-Anesthesia Assessment:                           - Prior to the procedure, a History and Physical                            was performed, and patient medications and                            allergies were reviewed. The patient's tolerance of                            previous anesthesia was also reviewed. The risks                            and benefits of the procedure and the sedation                            options and risks were discussed with the patient.                            All questions were answered, and informed consent                            was obtained. Prior Anticoagulants: The patient has                            taken no anticoagulant or antiplatelet agents. ASA                            Grade Assessment: III - A patient with severe                            systemic disease. After reviewing the risks and  benefits, the patient was deemed in satisfactory                            condition to undergo the procedure.                           After obtaining informed consent, the colonoscope                            was passed under direct vision. Throughout the                            procedure, the patient's blood pressure, pulse, and                            oxygen saturations  were monitored continuously. The                            [id number]) scope was introduced through the                            anus and advanced to the the cecum, identified by                            appendiceal orifice and ileocecal valve. The                            colonoscopy was performed without difficulty. The                            patient tolerated the procedure well. The quality                            of the bowel preparation was adequate. The                            ileocecal valve, appendiceal orifice, and rectum                            were photographed. Scope In: 8:10:22 AM Scope Out: 9:24:59 AM Scope Withdrawal Time: 0 hours 58 minutes 50 seconds  Total Procedure Duration: 1 hour 14 minutes 37 seconds  Findings:      The perianal and digital rectal examinations were normal. Patient had a       long and redundant colon.      (5) semipedunculated polyps 5 to 8 mm in the ascending segment or cold       snare removed.      Five semi-pedunculated polyps were found in the descending colon and       ascending colon. The polyps were 8 to 25 mm in size. These polyps were       removed with a hot snare. Resection and retrieval were complete.       Estimated blood loss: none.      The largest 2.5 cm pedunculated polyp in the descending segment was  removed piecemeal. It was felt to be completely removed. The edges       polypectomy site was sealed with the tip of the snare loop. I did use       1.5 cc of Ella view to lift the back (proximal) side. For complete       polypectomy. This polyp was marked with ink just distally to the       polypectomy site Impression:               - Five 8 to 15 mm polyps in the descending colon                            and in the ascending colon removed with cold snare..                           - Five 8 to 25 mm polyps in the descending colon                            and in the ascending colon, removed with  a hot                            snare. Resected and retrieved. Markedly redundant                            colon. Moderate Sedation:      Moderate (conscious) sedation was personally administered by an       anesthesia professional. The following parameters were monitored: oxygen       saturation, heart rate, blood pressure, respiratory rate, EKG, adequacy       of pulmonary ventilation, and response to care. Recommendation:           - Patient has a contact number available for                            emergencies. The signs and symptoms of potential                            delayed complications were discussed with the                            patient. Return to normal activities tomorrow.                            Written discharge instructions were provided to the                            patient.                           - Advance diet as tolerated. Follow-up on                            pathology. Will need early interval follow-up  colonoscopy depending on pathology results. Procedure Code(s):        --- Professional ---                           [phone], Colonoscopy, flexible; with removal of                            tumor(s), polyp(s), or other lesion(s) by snare                            technique Diagnosis Code(s):        --- Professional ---                           D12.4, Benign neoplasm of descending colon                           D12.2, Benign neoplasm of ascending colon                           K92.1, Melena (includes Hematochezia) CPT copyright 2022 American Medical Association. All rights reserved. The codes documented in this report are preliminary and upon coder review may  be revised to meet current compliance requirements. Gerrit Friends. Herschell Virani, MD Gennette Pac, MD 12/14/2022 9:49:25 AM This report has been signed electronically. Number of Addenda: 0

## 2022-12-14 NOTE — Anesthesia Procedure Notes (Signed)
Date/Time: 12/14/2022 8:09 AM  Performed by: Julian Reil, CRNAPre-anesthesia Checklist: Patient identified, Emergency Drugs available, Suction available and Patient being monitored Patient Re-evaluated:Patient Re-evaluated prior to induction Oxygen Delivery Method: Non-rebreather mask Induction Type: IV induction Placement Confirmation: positive ETCO2

## 2022-12-14 NOTE — Progress Notes (Signed)
  December 14, 2022  Patient: Todd Flynn  Date of Birth:   Date of Visit: 10/31/2022    To Whom It May Concern:  Todd Flynn was seen and treated in our Endoscopy center on  December 14, 2022. Todd Flynn may return to work on Saturday, December 15, 2022 .  Sincerely,   Jeani Hawking Short Stay

## 2022-12-14 NOTE — Anesthesia Postprocedure Evaluation (Signed)
Anesthesia Post Note  Patient: Todd Flynn  Procedure(s) Performed: COLONOSCOPY WITH PROPOFOL  Patient location during evaluation: Phase II Anesthesia Type: General Level of consciousness: awake and alert and oriented Pain management: pain level controlled Vital Signs Assessment: post-procedure vital signs reviewed and stable Respiratory status: spontaneous breathing, nonlabored ventilation and respiratory function stable Cardiovascular status: blood pressure returned to baseline and stable Postop Assessment: no apparent nausea or vomiting Anesthetic complications: no  No notable events documented.   Last Vitals:  Vitals:   12/14/22 0938 12/14/22 0943  BP: (!) 88/50 111/67  Pulse:    Resp:    Temp:    SpO2:      Last Pain:  Vitals:   12/14/22 0943  TempSrc:   PainSc: 0-No pain                 Ismahan Lippman C Fadil Macmaster

## 2022-12-14 NOTE — Discharge Instructions (Addendum)
  Colonoscopy Discharge Instructions  Read the instructions outlined below and refer to this sheet in the next few weeks. These discharge instructions provide you with general information on caring for yourself after you leave the hospital. Your doctor may also give you specific instructions. While your treatment has been planned according to the most current medical practices available, unavoidable complications occasionally occur. If you have any problems or questions after discharge, call Dr. Jena Gauss at [phone]. ACTIVITY You may resume your regular activity, but move at a slower pace for the next 24 hours.  Take frequent rest periods for the next 24 hours.  Walking will help get rid of the air and reduce the bloated feeling in your belly (abdomen).  No driving for 24 hours (because of the medicine (anesthesia) used during the test).   Do not sign any important legal documents or operate any machinery for 24 hours (because of the anesthesia used during the test).  NUTRITION Drink plenty of fluids.  You may resume your normal diet as instructed by your doctor.  Begin with a light meal and progress to your normal diet. Heavy or fried foods are harder to digest and may make you feel sick to your stomach (nauseated).  Avoid alcoholic beverages for 24 hours or as instructed.  MEDICATIONS You may resume your normal medications unless your doctor tells you otherwise.  WHAT YOU CAN EXPECT TODAY Some feelings of bloating in the abdomen.  Passage of more gas than usual.  Spotting of blood in your stool or on the toilet paper.  IF YOU HAD POLYPS REMOVED DURING THE COLONOSCOPY: No aspirin products for 7 days or as instructed.  No alcohol for 7 days or as instructed.  Eat a soft diet for the next 24 hours.  FINDING OUT THE RESULTS OF YOUR TEST Not all test results are available during your visit. If your test results are not back during the visit, make an appointment with your caregiver to find out the  results. Do not assume everything is normal if you have not heard from your caregiver or the medical facility. It is important for you to follow up on all of your test results.  SEEK IMMEDIATE MEDICAL ATTENTION IF: You have more than a spotting of blood in your stool.  Your belly is swollen (abdominal distention).  You are nauseated or vomiting.  You have a temperature over 101.  You have abdominal pain or discomfort that is severe or gets worse throughout the day.     You had multiple large polyps removed from your colon.  Further recommendations to follow pending review of pathology report  I called Portia strong at [phone]-no response.

## 2022-12-14 NOTE — H&P (Signed)
@LOGO @   Primary Care Physician:  Patient, No Pcp Per Primary Gastroenterologist:  Dr. Jena Gauss  Pre-Procedure History & Physical: HPI:  Todd Flynn is a 57 y.o. male here for colonoscopy to evaluate rectal bleeding. Rectal bleeding with ibuprofen use for hip pain.  Stop the ibuprofen rectal bleeding improved.  States he may have had a colonoscopy many years ago greater than 10 but he does not recall details. Past Medical History:  Diagnosis Date   Sciatica     History reviewed. No pertinent surgical history.  Prior to Admission medications   Medication Sig Start Date End Date Taking? Authorizing Provider  omeprazole (PRILOSEC) 20 MG capsule Take 1 capsule (20 mg total) by mouth 2 (two) times daily before a meal. 10/21/22 12/14/22 Yes Kommor, Madison, MD  tadalafil (CIALIS) 5 MG tablet Take 5 mg by mouth daily as needed for erectile dysfunction. 08/29/22  Yes [provider]  promethazine (PHENERGAN) 25 MG tablet Take 1 tablet (25 mg total) by mouth every 6 (six) hours as needed for nausea or vomiting. Patient not taking: Reported on 12/10/2022 12/19/17   Jacalyn Lefevre, MD  sucralfate (CARAFATE) 1 GM/10ML suspension Take 10 mLs (1 g total) by mouth 4 (four) times daily -  with meals and at bedtime. Patient not taking: Reported on 12/10/2022 01/05/17   Maia Plan, MD    Allergies as of 10/31/2022 - Review Complete 10/31/2022  Allergen Reaction Noted   Shrimp (diagnostic) Swelling 10/21/2022    History reviewed. No pertinent family history.  Social History   Socioeconomic History   Marital status: Single    Spouse name: Not on file   Number of children: Not on file   Years of education: Not on file   Highest education level: Not on file  Occupational History   Not on file  Tobacco Use   Smoking status: Never   Smokeless tobacco: Never  Vaping Use   Vaping Use: Never used  Substance and Sexual Activity   Alcohol use: No   Drug use: No   Sexual activity: Not on  file  Other Topics Concern   Not on file  Social History Narrative   Not on file   Social Determinants of Health   Financial Resource Strain: Not on file  Food Insecurity: Not on file  Transportation Needs: Not on file  Physical Activity: Not on file  Stress: Not on file  Social Connections: Not on file  Intimate Partner Violence: Not on file    Review of Systems: See HPI, otherwise negative ROS  Physical Exam: BP 137/80 (BP Location: Right Arm)   Pulse 71   Temp 98.3 F (36.8 C)   Resp 20   SpO2 99%  General:   Alert,  Well-developed, well-nourished, pleasant and cooperative in NAD Neck:  Supple; no masses or thyromegaly. No significant cervical adenopathy. Lungs:  Clear throughout to auscultation.   No wheezes, crackles, or rhonchi. No acute distress. Heart:  Regular rate and rhythm; no murmurs, clicks, rubs,  or gallops. Abdomen: Non-distended, normal bowel sounds.  Soft and nontender without appreciable mass or hepatosplenomegaly.  Pulses:  Normal pulses noted. Extremities:  Without clubbing or edema.  Impression/Plan: 57 year old morbidly obese gentleman here for further evaluation of rectal bleeding via updated colonoscopy. The risks, benefits, limitations, alternatives and imponderables have been reviewed with the patient. Questions have been answered. All parties are agreeable.       Notice: This dictation was prepared with Dragon dictation along with smaller phrase technology.  Any transcriptional errors that result from this process are unintentional and may not be corrected upon review.

## 2022-12-14 NOTE — Anesthesia Preprocedure Evaluation (Signed)
Anesthesia Evaluation  Patient identified by MRN, date of birth, ID band Patient awake    Reviewed: Allergy & Precautions, H&P , NPO status , Patient's Chart, lab work & pertinent test results  Airway Mallampati: III  TM Distance: >3 FB Neck ROM: Full    Dental  (+) Dental Advisory Given, Loose, Poor Dentition, Chipped,    Pulmonary neg pulmonary ROS   Pulmonary exam normal breath sounds clear to auscultation       Cardiovascular negative cardio ROS Normal cardiovascular exam Rhythm:Regular Rate:Normal     Neuro/Psych  Neuromuscular disease  negative psych ROS   GI/Hepatic negative GI ROS, Neg liver ROS,,,  Endo/Other    Morbid obesity  Renal/GU negative Renal ROS  negative genitourinary   Musculoskeletal negative musculoskeletal ROS (+)    Abdominal   Peds negative pediatric ROS (+)  Hematology negative hematology ROS (+)   Anesthesia Other Findings Sciatica   Reproductive/Obstetrics negative OB ROS                             Anesthesia Physical Anesthesia Plan  ASA: 3  Anesthesia Plan: General   Post-op Pain Management: Minimal or no pain anticipated   Induction: Intravenous  PONV Risk Score and Plan: Propofol infusion  Airway Management Planned: Nasal Cannula and Natural Airway  Additional Equipment:   Intra-op Plan:   Post-operative Plan:   Informed Consent: I have reviewed the patients History and Physical, chart, labs and discussed the procedure including the risks, benefits and alternatives for the proposed anesthesia with the patient or authorized representative who has indicated his/her understanding and acceptance.     Dental advisory given  Plan Discussed with: CRNA and Surgeon  Anesthesia Plan Comments:        Anesthesia Quick Evaluation

## 2022-12-20 LAB — SURGICAL PATHOLOGY

## 2022-12-22 ENCOUNTER — Encounter: Payer: Self-pay | Admitting: Internal Medicine

## 2022-12-24 ENCOUNTER — Encounter (HOSPITAL_COMMUNITY): Payer: Self-pay | Admitting: Internal Medicine

## 2023-01-18 ENCOUNTER — Encounter (HOSPITAL_COMMUNITY): Payer: Self-pay | Admitting: Emergency Medicine

## 2023-01-18 ENCOUNTER — Emergency Department (HOSPITAL_COMMUNITY)
Admission: EM | Admit: 2023-01-18 | Discharge: 2023-01-18 | Disposition: A | Discharge status: Home / Self Care | Payer: 59 | Attending: Emergency Medicine | Admitting: Emergency Medicine

## 2023-01-18 ENCOUNTER — Other Ambulatory Visit: Payer: Self-pay

## 2023-01-18 DIAGNOSIS — K625 Hemorrhage of anus and rectum: Secondary | ICD-10-CM | POA: Diagnosis present

## 2023-01-18 LAB — COMPREHENSIVE METABOLIC PANEL
ALT: 23 U/L (ref 0–44)
AST: 18 U/L (ref 15–41)
Albumin: 3.6 g/dL (ref 3.5–5.0)
Alkaline Phosphatase: 66 U/L (ref 38–126)
Anion gap: 8 (ref 5–15)
BUN: 18 mg/dL (ref 6–20)
CO2: 26 mmol/L (ref 22–32)
Calcium: 9.3 mg/dL (ref 8.9–10.3)
Chloride: 105 mmol/L (ref 98–111)
Creatinine, Ser: 1.54 mg/dL — ABNORMAL HIGH (ref 0.61–1.24)
GFR, Estimated: 52 mL/min — ABNORMAL LOW (ref >60)
Glucose, Bld: 113 mg/dL — ABNORMAL HIGH (ref 70–99)
Potassium: 3.9 mmol/L (ref 3.5–5.1)
Sodium: 139 mmol/L (ref 135–145)
Total Bilirubin: 0.5 mg/dL (ref 0.3–1.2)
Total Protein: 7.5 g/dL (ref 6.5–8.1)

## 2023-01-18 LAB — URINALYSIS, ROUTINE W REFLEX MICROSCOPIC
Bacteria, UA: NONE SEEN
Bilirubin Urine: NEGATIVE
Glucose, UA: NEGATIVE mg/dL
Ketones, ur: NEGATIVE mg/dL
Leukocytes,Ua: NEGATIVE
Nitrite: NEGATIVE
Protein, ur: NEGATIVE mg/dL
Specific Gravity, Urine: 1.014 (ref 1.005–1.030)
pH: 6 (ref 5.0–8.0)

## 2023-01-18 LAB — CBC
HCT: 40.7 % (ref 39.0–52.0)
Hemoglobin: 13.6 g/dL (ref 13.0–17.0)
MCH: 27 pg (ref 26.0–34.0)
MCHC: 33.4 g/dL (ref 30.0–36.0)
MCV: 80.9 fL (ref 80.0–100.0)
Platelets: 172 10*3/uL (ref 150–400)
RBC: 5.03 MIL/uL (ref 4.22–5.81)
RDW: 15.3 % (ref 11.5–15.5)
WBC: 6.6 10*3/uL (ref 4.0–10.5)
nRBC: 0 % (ref 0.0–0.2)

## 2023-01-18 LAB — TYPE AND SCREEN
ABO/RH(D): A POS
Antibody Screen: NEGATIVE

## 2023-01-18 NOTE — ED Provider Notes (Signed)
[place] EM[facility]ENC[facility] DEPART[facility] AT [facility] Provider Note   CSN: [id number] Arrival date & time: 01/18/23  1739     History  Chief Complaint  Patient presents with   Rectal Bleeding   Hematuria    Todd Flynn is a 57 y.o. male.  Patient here with reports some red blood per rectum on Monday with a bowel movement.  And then had it occur again today.  No bleeding in between but no bowel movements in between.  No pain.  Also in the suprapubic area has a blister that he thinks may be just from rubbing on his underwear from the skin fold.  Patient denies any abdominal pain.  Patient had some rectal bleeding in the spring and was seen by Dr. Jena Gauss who did a colonoscopy in April without any significant findings.  Patient states that the blood was red showed a picture of today.  It did not stain the toilet water all red.  And some of it was on the toilet paper.  Patient had a polypectomy with a colonoscopy in April.  According to Rourk's note it was a benign process.  Past medical history significant for just sciatica.  Patient does not use tobacco products.  Patient denies any chest pain shortness of breath any lightheadedness or feeling like he is going to pass out.  No nausea vomiting or diarrhea.       Home Medications Prior to Admission medications   Medication Sig Start Date End Date Taking? Authorizing Provider  omeprazole (PRILOSEC) 20 MG capsule Take 1 capsule (20 mg total) by mouth 2 (two) times daily before a meal. 10/21/22 12/14/22  Kommor, Madison, MD  promethazine (PHENERGAN) 25 MG tablet Take 1 tablet (25 mg total) by mouth every 6 (six) hours as needed for nausea or vomiting. Patient not taking: Reported on 12/10/2022 12/19/17   Jacalyn Lefevre, MD  sucralfate (CARAFATE) 1 GM/10ML suspension Take 10 mLs (1 g total) by mouth 4 (four) times daily -  with meals and at bedtime. Patient not taking: Reported on 12/10/2022 01/05/17   Long, Arlyss Repress, MD  tadalafil (CIALIS) 5  MG tablet Take 5 mg by mouth daily as needed for erectile dysfunction. 08/29/22   [provider]      Allergies    Shrimp (diagnostic)    Review of Systems   Review of Systems  Constitutional:  Negative for chills and fever.  HENT:  Negative for ear pain and sore throat.   Eyes:  Negative for pain and visual disturbance.  Respiratory:  Negative for cough and shortness of breath.   Cardiovascular:  Negative for chest pain and palpitations.  Gastrointestinal:  Positive for blood in stool. Negative for abdominal pain and vomiting.  Genitourinary:  Negative for dysuria and hematuria.  Musculoskeletal:  Negative for arthralgias and back pain.  Skin:  Negative for color change and rash.  Neurological:  Negative for seizures and syncope.  All other systems reviewed and are negative.   Physical Exam Updated Vital Signs BP 128/78 (BP Location: Right Arm)   Pulse 88   Temp 98.2 F (36.8 C) (Oral)   Resp 17   Ht 1.943 m (6' 4.5")   Wt (!) 166 kg   SpO2 100%   BMI 43.97 kg/m  Physical Exam Vitals and nursing note reviewed.  Constitutional:      General: He is not in acute distress.    Appearance: Normal appearance. He is well-developed.  HENT:     Head:  Normocephalic and atraumatic.  Eyes:     Extraocular Movements: Extraocular movements intact.     Conjunctiva/sclera: Conjunctivae normal.     Pupils: Pupils are equal, round, and reactive to light.  Cardiovascular:     Rate and Rhythm: Normal rate and regular rhythm.     Heart sounds: No murmur heard. Pulmonary:     Effort: Pulmonary effort is normal. No respiratory distress.     Breath sounds: Normal breath sounds.  Abdominal:     Palpations: Abdomen is soft.     Tenderness: There is no abdominal tenderness.     Comments: Suprapubic area with a blister measuring about a centimeter.  No surrounding erythema nontender.  Does not appear to be infected.  Genitourinary:    Comments: Rectal exam normal light brown stool  but Hemoccult positive.  No gross blood.  No anal fissure.  No prolapsed internal hemorrhoids no external hemorrhoids. Musculoskeletal:        General: No swelling.     Cervical back: Normal range of motion and neck supple.  Skin:    General: Skin is warm and dry.     Capillary Refill: Capillary refill takes less than 2 seconds.  Neurological:     General: No focal deficit present.     Mental Status: He is alert and oriented to person, place, and time.     Cranial Nerves: No cranial nerve deficit.     Sensory: No sensory deficit.     Motor: No weakness.  Psychiatric:        Mood and Affect: Mood normal.     ED Results / Procedures / Treatments   Labs (all labs ordered are listed, but only abnormal results are displayed) Labs Reviewed  URINALYSIS, ROUTINE W REFLEX MICROSCOPIC - Abnormal; Notable for the following components:      Result Value   Hgb urine dipstick SMALL (*)    All other components within normal limits  COMPREHENSIVE METABOLIC PANEL - Abnormal; Notable for the following components:   Glucose, Bld 113 (*)    Creatinine, Ser 1.54 (*)    GFR, Estimated 52 (*)    All other components within normal limits  CBC  POC OCCULT BLOOD, ED  TYPE AND SCREEN    EKG None  Radiology No results found.  Procedures Procedures    Medications Ordered in ED Medications - No data to display  ED Course/ Medical Decision Making/ A&P                             Medical Decision Making Amount and/or Complexity of Data Reviewed Labs: ordered.  GI bleed.  Patient hemodynamically stable.  No gross blood per rectum.  No acute distress.  Urinalysis negative no signs urinary tract infection.  Complete metabolic panel creatinine 1.54 GFR 52 but LFTs normal and electrolytes normal.  White blood cell count 6.6 hemoglobin 13.6 platelets 172.  No significant anemia.  Patient hemodynamically stable has not had a significant amount of red blood per rectum had 1 on Monday without  staining the toilet water all red and 1 today without staining is all water all red.  Feel the patient is stable for discharge home follow back up with GI precautions provided.  That if he has 2-3 bloody bowel movements staining the toilet water all red in a day he needs to get seen or if he starts to develop abdominal pain or feels like he is going to  pass out.    Final Clinical Impression(s) / ED Diagnoses Final diagnoses:  Rectal bleeding    Rx / DC Orders ED Discharge Orders     None         Vanetta Mulders, MD 01/18/23 2227

## 2023-01-18 NOTE — ED Triage Notes (Signed)
Pt states he had a colonoscopy on April 25 to help stop rectal bleeding. He reports that rectal bleeding restarted this past Monday with frank blood in toilet with bowel movements. Pt denies abdominal pain. He also reports that he has hematuria. No penile pain either and denies blood thinners.

## 2023-01-18 NOTE — Discharge Instructions (Signed)
Make an appointment to follow-up with gastroenterology Dr. Luvenia Starch information provided above.  Call on Monday for follow-up.  Return for red blood staining the toilet water all red twice or 3 times in a day.  Follow-up for any new or worse symptoms.

## 2023-06-05 ENCOUNTER — Encounter: Payer: Self-pay | Admitting: *Deleted
